# Patient Record
Sex: Female | Born: 1970 | Race: White | Hispanic: No | Marital: Single | Smoking: Former smoker
Health system: Southern US, Community
[De-identification: ages and names within clinical notes are randomized; demographics above are authoritative.]

## PROBLEM LIST (undated history)

## (undated) DIAGNOSIS — J45909 Unspecified asthma, uncomplicated: Secondary | ICD-10-CM

## (undated) DIAGNOSIS — G40909 Epilepsy, unspecified, not intractable, without status epilepticus: Secondary | ICD-10-CM

## (undated) DIAGNOSIS — I2510 Atherosclerotic heart disease of native coronary artery without angina pectoris: Secondary | ICD-10-CM

## (undated) DIAGNOSIS — I10 Essential (primary) hypertension: Secondary | ICD-10-CM

## (undated) DIAGNOSIS — E785 Hyperlipidemia, unspecified: Secondary | ICD-10-CM

## (undated) HISTORY — PX: SPINAL FUSION: SHX223

## (undated) HISTORY — PX: KNEE ARTHROSCOPY: SUR90

## (undated) HISTORY — PX: PARTIAL HYSTERECTOMY: SHX80

## (undated) HISTORY — PX: LAMINECTOMY: SHX219

---

## 1997-04-15 ENCOUNTER — Ambulatory Visit (HOSPITAL_COMMUNITY): Admission: RE | Admit: 1997-04-15 | Discharge: 1997-04-15 | Payer: Self-pay | Admitting: Obstetrics and Gynecology

## 1997-07-20 ENCOUNTER — Emergency Department (HOSPITAL_COMMUNITY): Admission: EM | Admit: 1997-07-20 | Discharge: 1997-07-20 | Payer: Self-pay | Admitting: Emergency Medicine

## 1997-07-22 ENCOUNTER — Ambulatory Visit (HOSPITAL_COMMUNITY): Admission: RE | Admit: 1997-07-22 | Discharge: 1997-07-22 | Payer: Self-pay | Admitting: Family Medicine

## 1998-06-11 ENCOUNTER — Ambulatory Visit (HOSPITAL_COMMUNITY): Admission: RE | Admit: 1998-06-11 | Discharge: 1998-06-11 | Payer: Self-pay | Admitting: Obstetrics and Gynecology

## 1998-06-13 ENCOUNTER — Inpatient Hospital Stay (HOSPITAL_COMMUNITY): Admission: AD | Admit: 1998-06-13 | Discharge: 1998-06-13 | Payer: Self-pay | Admitting: Obstetrics and Gynecology

## 1999-07-08 ENCOUNTER — Other Ambulatory Visit: Admission: RE | Admit: 1999-07-08 | Discharge: 1999-07-08 | Payer: Self-pay | Admitting: Obstetrics and Gynecology

## 1999-08-02 ENCOUNTER — Ambulatory Visit (HOSPITAL_COMMUNITY): Admission: RE | Admit: 1999-08-02 | Discharge: 1999-08-02 | Payer: Self-pay | Admitting: Obstetrics and Gynecology

## 2002-01-03 ENCOUNTER — Other Ambulatory Visit: Admission: RE | Admit: 2002-01-03 | Discharge: 2002-01-03 | Payer: Self-pay | Admitting: Obstetrics and Gynecology

## 2002-03-28 ENCOUNTER — Encounter (INDEPENDENT_AMBULATORY_CARE_PROVIDER_SITE_OTHER): Payer: Self-pay

## 2002-03-28 ENCOUNTER — Ambulatory Visit (HOSPITAL_COMMUNITY): Admission: RE | Admit: 2002-03-28 | Discharge: 2002-03-28 | Payer: Self-pay | Admitting: Obstetrics and Gynecology

## 2002-08-19 ENCOUNTER — Other Ambulatory Visit: Admission: RE | Admit: 2002-08-19 | Discharge: 2002-08-19 | Payer: Self-pay | Admitting: Obstetrics and Gynecology

## 2003-10-20 ENCOUNTER — Other Ambulatory Visit: Admission: RE | Admit: 2003-10-20 | Discharge: 2003-10-20 | Payer: Self-pay | Admitting: Obstetrics and Gynecology

## 2004-11-22 ENCOUNTER — Other Ambulatory Visit: Admission: RE | Admit: 2004-11-22 | Discharge: 2004-11-22 | Payer: Self-pay | Admitting: Obstetrics and Gynecology

## 2005-01-20 ENCOUNTER — Ambulatory Visit (HOSPITAL_COMMUNITY): Admission: RE | Admit: 2005-01-20 | Discharge: 2005-01-20 | Payer: Self-pay | Admitting: Obstetrics and Gynecology

## 2005-02-10 ENCOUNTER — Ambulatory Visit (HOSPITAL_COMMUNITY): Admission: AD | Admit: 2005-02-10 | Discharge: 2005-02-10 | Payer: Self-pay | Admitting: Obstetrics and Gynecology

## 2005-04-27 ENCOUNTER — Observation Stay (HOSPITAL_COMMUNITY): Admission: RE | Admit: 2005-04-27 | Discharge: 2005-04-28 | Payer: Self-pay | Admitting: Obstetrics and Gynecology

## 2005-04-27 ENCOUNTER — Encounter (INDEPENDENT_AMBULATORY_CARE_PROVIDER_SITE_OTHER): Payer: Self-pay | Admitting: *Deleted

## 2006-01-02 ENCOUNTER — Other Ambulatory Visit: Admission: RE | Admit: 2006-01-02 | Discharge: 2006-01-02 | Payer: Self-pay | Admitting: Obstetrics and Gynecology

## 2006-11-23 ENCOUNTER — Encounter: Admission: RE | Admit: 2006-11-23 | Discharge: 2006-11-23 | Payer: Self-pay | Admitting: Gastroenterology

## 2007-07-04 ENCOUNTER — Ambulatory Visit (HOSPITAL_COMMUNITY): Admission: RE | Admit: 2007-07-04 | Discharge: 2007-07-04 | Payer: Self-pay | Admitting: Neurology

## 2007-07-15 ENCOUNTER — Encounter: Admission: RE | Admit: 2007-07-15 | Discharge: 2007-07-15 | Payer: Self-pay | Admitting: Neurology

## 2007-08-02 ENCOUNTER — Encounter: Admission: RE | Admit: 2007-08-02 | Discharge: 2007-08-02 | Payer: Self-pay | Admitting: Neurology

## 2007-08-14 ENCOUNTER — Ambulatory Visit (HOSPITAL_COMMUNITY): Admission: RE | Admit: 2007-08-14 | Discharge: 2007-08-15 | Payer: Self-pay | Admitting: Neurosurgery

## 2007-09-16 ENCOUNTER — Encounter: Admission: RE | Admit: 2007-09-16 | Discharge: 2007-09-16 | Payer: Self-pay | Admitting: Neurosurgery

## 2007-10-18 ENCOUNTER — Ambulatory Visit (HOSPITAL_COMMUNITY): Admission: RE | Admit: 2007-10-18 | Discharge: 2007-10-18 | Payer: Self-pay | Admitting: Neurosurgery

## 2007-10-25 ENCOUNTER — Ambulatory Visit (HOSPITAL_COMMUNITY): Admission: RE | Admit: 2007-10-25 | Discharge: 2007-10-26 | Payer: Self-pay | Admitting: Neurosurgery

## 2008-02-08 ENCOUNTER — Encounter: Admission: RE | Admit: 2008-02-08 | Discharge: 2008-02-08 | Payer: Self-pay | Admitting: Neurosurgery

## 2008-02-25 ENCOUNTER — Inpatient Hospital Stay (HOSPITAL_COMMUNITY): Admission: RE | Admit: 2008-02-25 | Discharge: 2008-02-28 | Payer: Self-pay | Admitting: Neurosurgery

## 2008-07-31 ENCOUNTER — Encounter: Admission: RE | Admit: 2008-07-31 | Discharge: 2008-07-31 | Payer: Self-pay | Admitting: Neurosurgery

## 2009-04-27 ENCOUNTER — Encounter: Admission: RE | Admit: 2009-04-27 | Discharge: 2009-04-27 | Payer: Self-pay | Admitting: Neurosurgery

## 2010-04-18 IMAGING — CT CT L SPINE W/ CM
4 of 10 series · 10 of 33 positions shown, 12 images · IV contrast (omnipaque)
Comparison: Previous myelogram CT 07/31/2008. Previous MRI
02/08/2008.

MYELOGRAM INJECTION
TECHNIQUE: Informed consent was obtained from the patient prior to
the procedure, including potential complications of headache,
allergy, infection and pain. Specific instructions were given
regarding 24 hour bedrest post procedure to prevent post-LP
headache.  A timeout procedure was performed.  With the patient
prone, the lower back was prepped with Betadine.  1% Lidocaine was
used for local anesthesia.  Lumbar puncture was performed by the
radiologist at the L5 level using a 22 gauge needle with return of
clear CSF.  15 cc of Omnipaque 180 was injected into the
subarachnoid space .
CLINICAL DATA: Low back pain.  Right leg pain.
TECHNIQUE: Multidetector CT imaging of the lumbar spine was
performed following myelography.  Multiplanar CT image
reconstructions were also generated.

[Series 2: l spine bone · axial · 0.27mm/px · z∈[-178,-105]mm · 2 of 89 slices shown, 3 images]
[im 30/89  soft-tissue]
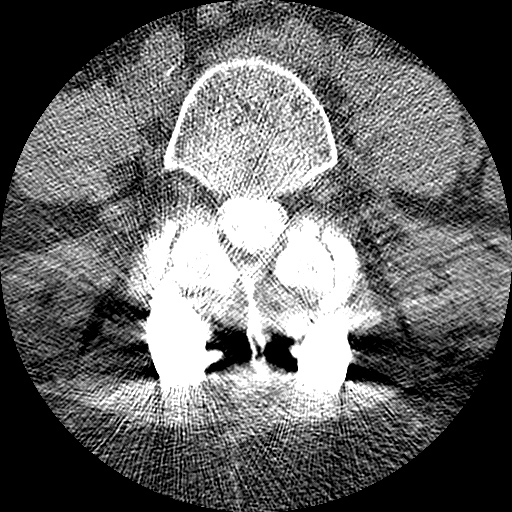
[im 30/89  bone]
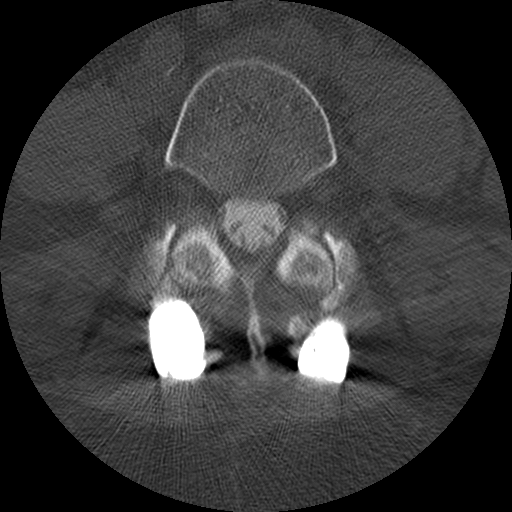
[im 59/89  bone]
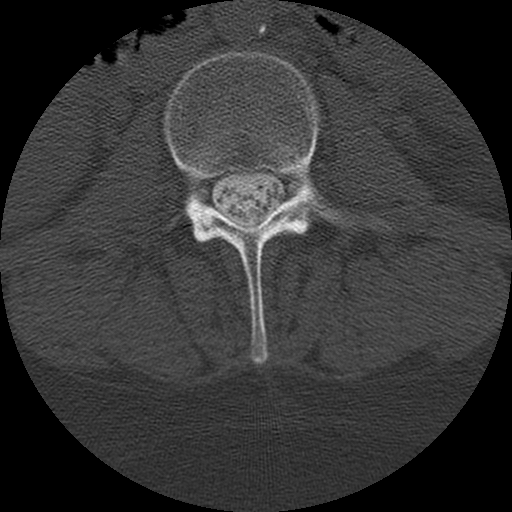

[Series 3: l spine soft · axial · 0.27mm/px · z∈[-178,-105]mm · 2 of 89 slices shown]
[im 30/89  soft-tissue]
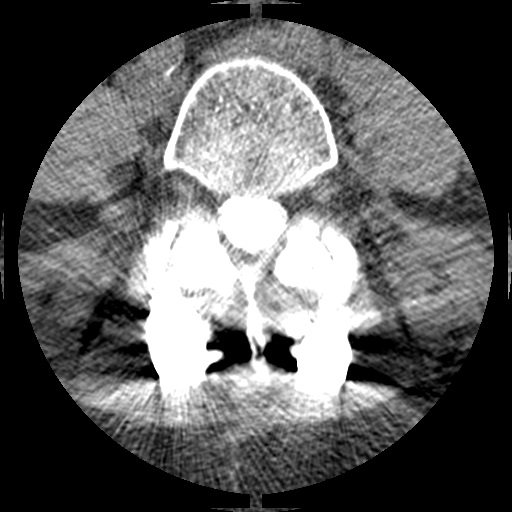
[im 59/89  soft-tissue]
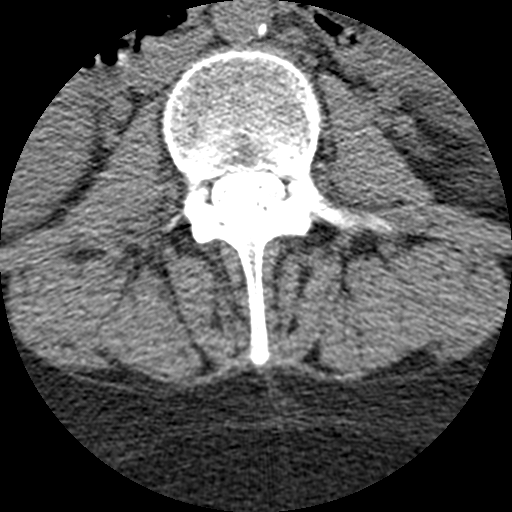

[Series 104: cor lower l-spine · coronal · 0.44mm/px · 1 of 40 slices shown]
[im 20/40  bone]
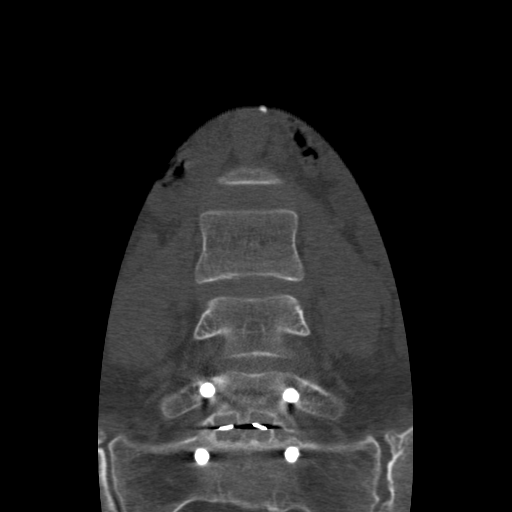

[Series 105: sag l-spine · sagittal · 0.44mm/px · 5 of 44 slices shown, 6 images]
[im 15/44  bone]
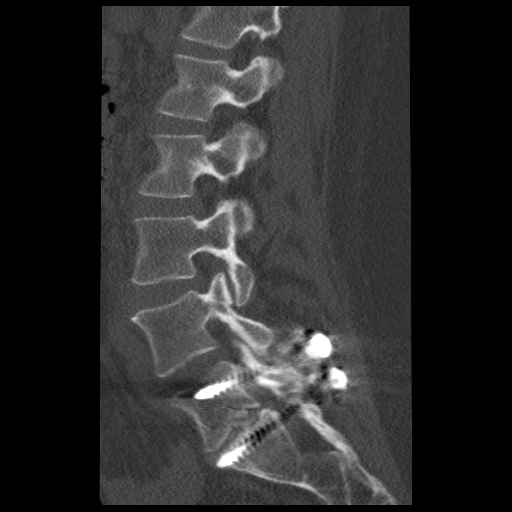
[im 18/44  bone]
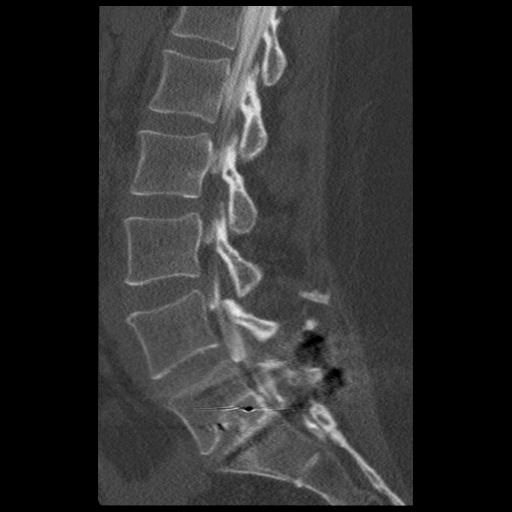
[im 22/44  soft-tissue]
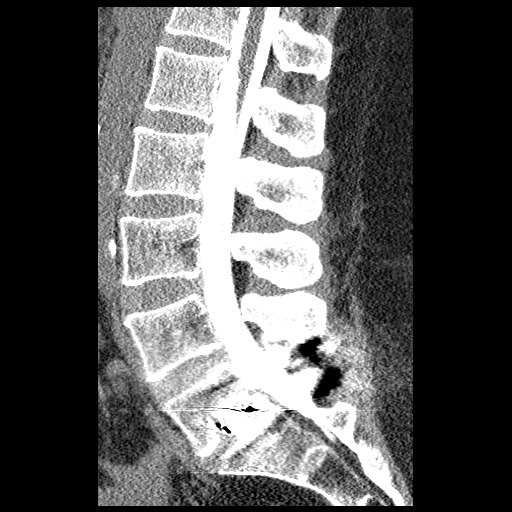
[im 22/44  bone]
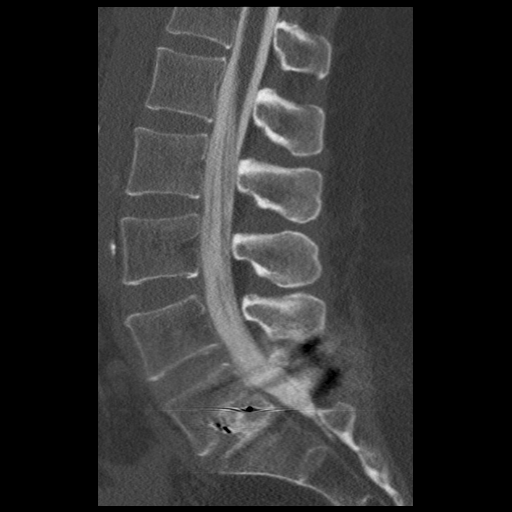
[im 26/44  bone]
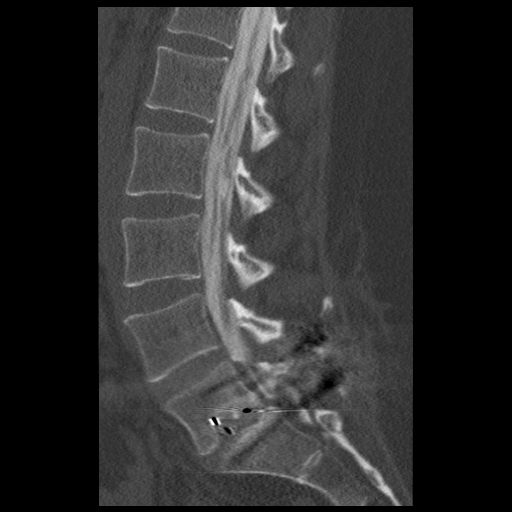
[im 29/44  bone]
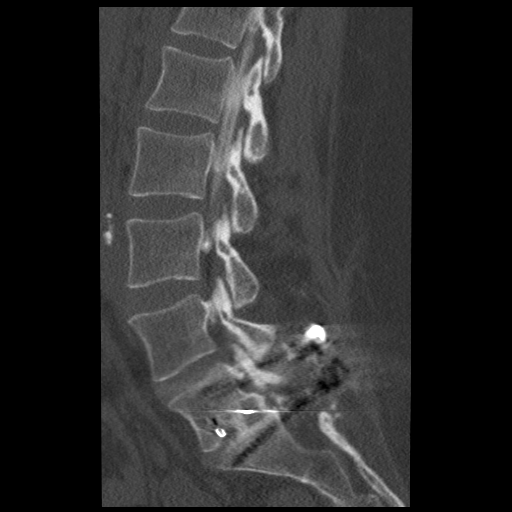

[10 of 33 positions shown; findings below may reference images not displayed]

IMPRESSION: Successful injection of  intrathecal contrast for myelography.

MYELOGRAM LUMBAR
FINDINGS: There is good opacification of the lumbar subarachnoid
space.  There is no nerve root cut off or spinal stenosis.  There
has been previous L5-S1 interbody fusion augmented with pedicle
screw and rod fixation.  Hardware is intact.  The interspace is
maintained.  There is no extradural defect or scarring at the L5-S1
level.  There is no obvious adjacent segment disease.

Standing flexion/extension views demonstrate no dynamic
instability.

Fluoroscopy Time: 0.51 minutes
IMPRESSION: As above.

CT MYELOGRAPHY LUMBAR SPINE
FINDINGS: No prevertebral or paraspinous masses.  Conus medullaris
normal.

L1-2: Normal.

L2-3: Normal.

L3-4: Small extraforaminal protrusion on the left without
compression of the left L3 nerve root.  Mild facet arthropathy.
Mild annular bulging.  There is no L4 nerve root compromise in the
canal.

L4-5: Moderate facet arthropathy.  Mild annular bulging.  No
stenosis or disc protrusion.

L5-S1: Solid fusion.  Hardware intact and appropriately placed.  No
canal stenosis or significant foraminal narrowing.

Compared to the previous myelogram and CT there has been further
healing of the fusion at L5-S1.  No adverse features are
appreciated.
IMPRESSION: Satisfactory appearance status post L5-S1 fusion.  Fusion is solid
and hardware is appropriately placed and intact.  There is no
residual stenosis or nerve root encroachment, no dynamic
instability, and no significant foraminal narrowing.

Mild annular bulging L4-5.

## 2010-05-30 LAB — COMPREHENSIVE METABOLIC PANEL
ALT: 18 U/L (ref 0–35)
AST: 22 U/L (ref 0–37)
Albumin: 3.3 g/dL — ABNORMAL LOW (ref 3.5–5.2)
Alkaline Phosphatase: 41 U/L (ref 39–117)
BUN: 7 mg/dL (ref 6–23)
Chloride: 112 mEq/L (ref 96–112)
Potassium: 3.9 mEq/L (ref 3.5–5.1)
Sodium: 144 mEq/L (ref 135–145)
Total Bilirubin: 0.9 mg/dL (ref 0.3–1.2)

## 2010-05-30 LAB — DIFFERENTIAL
Basophils Absolute: 0.1 10*3/uL (ref 0.0–0.1)
Basophils Relative: 1 % (ref 0–1)
Eosinophils Absolute: 0.2 10*3/uL (ref 0.0–0.7)
Eosinophils Relative: 4 % (ref 0–5)
Monocytes Absolute: 0.5 10*3/uL (ref 0.1–1.0)
Monocytes Relative: 10 % (ref 3–12)
Neutro Abs: 2 10*3/uL (ref 1.7–7.7)

## 2010-05-30 LAB — PROTIME-INR
INR: 1 (ref 0.00–1.49)
Prothrombin Time: 13.7 seconds (ref 11.6–15.2)

## 2010-05-30 LAB — CBC
HCT: 38.3 % (ref 36.0–46.0)
Platelets: 250 10*3/uL (ref 150–400)
WBC: 5 10*3/uL (ref 4.0–10.5)

## 2010-05-30 LAB — APTT: aPTT: 30 seconds (ref 24–37)

## 2010-06-28 NOTE — H&P (Signed)
NAME:  Morgan Leon, Morgan Leon NO.:  [id number]   MEDICAL RECORD NO.:  [id number]          PATIENT TYPE:  OIB   LOCATION:  3535                         FACILITY:  [facility]   PHYSICIAN:  Hilda Lias, M.D.   DATE OF BIRTH:  Aug 13, 1970   DATE OF ADMISSION:  10/25/2007  DATE OF DISCHARGE:                              HISTORY & PHYSICAL   Morgan Leon is a lady who underwent L5-S1 diskectomy in the right side 2  months ago.  The patient did well, but later she developed some back  pain.  She fell, from then on continued to get worse with pain going to  the right leg.  The patient came to my office, we did conservative  treatment.  We gave selective nerve root injection.  An MRI which was  not quite helpful.  Because of continuation of the pain, we did  myelogram, which showed that indeed there was some stenosis and some  scar tissue and difficult to see if indeed there was any recurrence.  The patient is not any better.  We have failed with every single  conservative treatment including strong pain medication without any  help.  She is being admitted for exploration of the area.   PAST MEDICAL HISTORY:  L5-S1 diskectomy 9 weeks ago.  She has a  hysterectomy, tubal ligation, and arthroscopy surgery.   ALLERGIES:  The patient allergic to PENICILLIN and SULFA.   FAMILY HISTORY:  Positive for high blood pressure and diabetes.   SOCIAL HISTORY:  She does not smoke any longer.  She drinks socially.   REVIEW OF SYSTEMS:  Positive for some headache, right leg pain, fainting  spells, congestive high blood pressure, and asthma.   PHYSICAL EXAMINATION:  GENERAL:  The patient came to my office limping  the right leg.  HEAD, EARS, NOSE, AND THROAT:  Normal.  NECK:  Normal.  LUNGS:  Rhonchi bilaterally.  HEART:  Heart sounds normal.  ABDOMEN:  Normal.  EXTREMITIES:  Normal pulse.  NEUROLOGIC:  She has straight leg raising on the right side, which is  positive 45 degrees.  She had  tenderness in the lumbar area.  Sensation  shows some numbness, which involved the outside of the right foot.   CLINICAL IMPRESSION:  Rule out recurrence L5-S1 herniated disk, right  side.   RECOMMENDATIONS:  The patient is being admitted for surgery.  We are  going to explore the L5-S1 area to see if there is something else beside  the stenosis, which might be giving the recurrence of the pain.  The  patient was at risks including possibility of infection, CSF leak, no  improve whatsoever, or need for surgery.           ______________________________  Hilda Lias, M.D.     EB/MEDQ  D:  10/25/2007  T:  10/26/2007  Job:  [id number]
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
## 2010-06-28 NOTE — H&P (Signed)
NAMETAHLOR, BERENGUER NO.:  [id number]   MEDICAL RECORD NO.:  [id number]          PATIENT TYPE:  INP   LOCATION:  3012                         FACILITY:  [facility]   PHYSICIAN:  Hilda Lias, M.D.   DATE OF BIRTH:  [birth year]   DATE OF ADMISSION:  02/25/2008  DATE OF DISCHARGE:                              HISTORY & PHYSICAL   Morgan Leon is a lady who underwent lumbar diskectomy.  Every time she did  well, but a few weeks later, she developed more back pain with leg pain.  After the procedure, the pain was intense.  She has quite a bit intense  back pain.  We did a workup to rule out the possibility of infection.  MRI showed she has a degenerative disk disease with a scar tissue on a  bone spur __________foraminal compromise in both nerve root.  Because of  the persistent pain and failure with conservative treatment including  __________, we are going to admit her for further surgery.   PAST MEDICAL HISTORY:  Hysterectomy, tubal ligation, arthroscopic  surgery, and lumbar diskectomy.   FAMILY HISTORY:  Positive for diabetes and high blood pressure.   SOCIAL HISTORY:  She drinks socially, does not smoke.   REVIEW OF SYSTEMS:  Positive for sinus headache, high blood pressure,  asthma, back pain, leg pain, spells, and fainting.   PHYSICAL EXAMINATION:  GENERAL:  The patient came to my office and she  walked with a short step.  She came on __________.  She was quite  miserable.  HEAD, EARS, NOSE, AND THROAT:  Normal.  NECK:  Normal.  LUNGS:  Clear.  ABDOMEN:  Normal.  EXTREMITIES:  Normal pulses.  NEUROLOGIC:  She has a decreased flexibility of the lumbar spine.  Straight leg raise is positive bilaterally about 45 degrees.   X-rays shows a severe degenerative disk disease with postoperative  changes at L5-S1, worse on the right side.   CLINICAL IMPRESSION:  Chronic back pain with degenerative disk disease  at L5-S1.   RECOMMENDATIONS:  The patient is being  admitted for surgery.  The  procedure will be bilateral L5-S1 diskectomy, interbody fusion with cage  and pedicle screws.  The patient knows about the risks of infection, CSF  leak, not improve whatsoever, and need for further surgery.           ______________________________  Hilda Lias, M.D.    EB/MEDQ  D:  02/25/2008  T:  02/26/2008  Job:  [id number]

## 2010-06-28 NOTE — Op Note (Signed)
[name], Morgan NO.:  [id number]   MEDICAL RECORD NO.:  [id number]          PATIENT TYPE:  INP   LOCATION:  3012                         FACILITY:  [facility]   PHYSICIAN:  Hilda Lias, M.D.   DATE OF BIRTH:  Jul 04, 1970   DATE OF PROCEDURE:  02/25/2008  DATE OF DISCHARGE:                               OPERATIVE REPORT   PREOPERATIVE DIAGNOSES:  Recurrent herniated disk with chronic back pain  and chronic radiculopathy.   POSTOPERATIVE DIAGNOSES:  Recurrent herniated disk with chronic back  pain and chronic radiculopathy.   PROCEDURES:  Bilateral L5 laminectomy, bilateral facetectomy, bilateral  diskectomy, 5-1.  Lysis of adhesions to decompress the right L5-S1 nerve  roots.  Interbody fusion with cages, pedicle screws, posterolateral  arthrodesis with BMP and autograft.  Cell Saver C-arm.   SURGEON:  Hilda Lias, MD.   ASSISTANT:  Danae Orleans. Venetia Maxon, MD   CLINICAL HISTORY:  [name] is a lady who had been complaining of back  pain radiating to the right leg.  She had been to Surgery.  The patient  did well initially, but later on continued to have more pain.  The  patient developed quite a bit of degenerative disk disease.  Diskitis  was ruled out.  Surgery was advised because of recurrence of the pain.   PROCEDURE:  The patient was taken to the OR and she was positioned in  prone manner.  The skin was cleaned with DuraPrep.  Midline incision  following the periwound was made all the way down to the spinous process  of L5.  Retraction was made all the way laterally until we are able to  see facet.  In the right side at the L5-S1 space was completely full of  adhesion.  Lysis was accomplished.  We were able to remove the scar from  the dural sac and the S1-L5 nerve root in the L5 than the S1.  We tried  to enter disk space, but we had to proceed with facetectomy to be able  to get into the disk space.  Once we went  into the disk space, we did  __________ discectomies removing the endplate.  Having concluded  diskectomy two cages of 12 x 22 with BMP and autograft were inserted.  Then using the C-arm first in AP view and later on a lateral view, we  drilled the pedicle of L5 and S1.  Four screws were inserted at the  level of endplate was 6.5 x 45 and at the level of S1 was 6.5 x 50.  Good position was seen in the x-ray.  Prior to introduction of the  pedicle screws, we brought the pedicle holes and they were surrounded by  bone in all quadrants.  The pedicle screw works using rods and caps.  We  went laterally and we removed the periosteum of the ala of the sacrum as  well as the facet of fine mix of BMP and autograft was used for  arthrodesis.  Valsalva maneuver was negative.  Fentanyl was left in the  pleural space and the wound was closed with  Vicryl and Steri-Strips.           ______________________________  Hilda Lias, M.D.     EB/MEDQ  D:  02/25/2008  T:  02/26/2008  Job:  [id number]

## 2010-06-28 NOTE — Op Note (Signed)
Morgan Leon, GEURIN NO.:  [id number]   MEDICAL RECORD NO.:  [id number]          PATIENT TYPE:  OIB   LOCATION:  3538                         FACILITY:  [facility]   PHYSICIAN:  Hilda Lias, M.D.   DATE OF BIRTH:  10/30/1970   DATE OF PROCEDURE:  08/14/2007  DATE OF DISCHARGE:                               OPERATIVE REPORT   PREOPERATIVE DIAGNOSIS:  Right L5-S1 herniated disk.   FINAL DIAGNOSIS:  Right L5-S1 herniated disk.   PROCEDURE:  Right L5-S1 diskectomy, removal of free fragment,  foraminotomy microscope.   SURGEON:  Hilda Lias, MD   ASSISTANT:  Danae Orleans. Venetia Maxon, MD   CLINICAL HISTORY:  The patient was admitted because of right leg pain,  which is getting worse and has failed all conservative treatment.  MRI  showed a large herniated disk at L5-S1 on the right side.  Surgery was  advised.   PROCEDURE:  The patient was taken to the OR and she was positioned in a  prone manner.  The skin was cleaned with DuraPrep.  The patient was  quite obese and x-rays showed __________ in the lower pole of the L5 and  S1.  A midline incision was made through the skin straight down to the  L5-S1 space.  With the microscope, we identified L5-S1 and we drilled  the lower lamina of L5 __________.  The yellow ligament also was  excised.  We found that the S1 nerve root was swollen, prepped, and  displaced.  There was a large herniated disk going to the body of S1.  Incision was made.  Large amount degenerative disk mediolaterally was  removed.  At the end, we had good decompression of the L5-S1 nerve root.  Then, Valsalva maneuver was negative.  Fentanyl and Depo-Medrol were  left in the epidural space.  The wound was closed with Vicryl and Steri-  Strips.           ______________________________  Hilda Lias, M.D.     EB/MEDQ  D:  08/14/2007  T:  08/15/2007  Job:  [id number]

## 2010-06-28 NOTE — Op Note (Signed)
[name], [name] NO.:  [id number]   MEDICAL RECORD NO.:  [id number]          PATIENT TYPE:  OIB   LOCATION:  3535                         FACILITY:  [facility]   PHYSICIAN:  Hilda Lias, M.D.   DATE OF BIRTH:  09/03/1970   DATE OF PROCEDURE:  10/25/2007  DATE OF DISCHARGE:                               OPERATIVE REPORT   PREOPERATIVE DIAGNOSIS:  Recurrence of right L5-S1 hernia disk.   POSTOPERATIVE DIAGNOSIS:  Recurrence of right L5-S1 hernia disk.   PROCEDURE:  Right L5-S1 exploration, removal of 2 fragment,  foraminotomy, decompression of the L5 and S1 root with microscope.   SURGEON:  Hilda Lias, M.D.   CLINICAL HISTORY:  [name] is a 40 year old female, obese, who about 9  weeks ago underwent right L5-S1 diskectomy.  The patient did well, but  then she fell.  Since then she has been having back pain, rising down to  the right leg.  She have failed with conservative treatment including  nerve root injection.  Myelogram was remarkable.  The MRIs show also  some postoperative changes.  Nevertheless, the patient has failed with  every single conservative treatment, and I was concerned about the  possibility of something not showing in x-ray.  In view of that she was  not admitted and she was absolutely bed rest, she was taken to surgery.   PROCEDURE:  The patient was taken to the OR.  She was positioned prone  on operating room.  The back was cleaned with DuraPrep.  The incision in  the midline through the previous one was made, through the skin,  subcutaneous tissue, through the thick adipose tissue down to the  fascia.  The fascia was opened, muscle was retracted laterally.  Immediately we brought the microscope into the area.  Indeed, once we  explored the S1 root there were 2 large fragment prior to the takeoff.  Cleaning of the S1 as well as the L5 nerves was done.  The disk was  quite narrow, but there was no evidence of any fragments at the  disk.  Then the area was irrigated.  The Valsalva maneuver was negative.  We  explored again the thecal sac, the midline, L5-S1 nerve root.  There was  no more fragment.  The wound was closed with Vicryl and dressed.           ______________________________  Hilda Lias, M.D.     EB/MEDQ  D:  10/25/2007  T:  10/26/2007  Job:  [id number]

## 2010-06-28 NOTE — Discharge Summary (Signed)
NAMETHIA, OLESEN NO.:  [id number]   MEDICAL RECORD NO.:  [id number]          PATIENT TYPE:  INP   LOCATION:  3012                         FACILITY:  [facility]   PHYSICIAN:  Hilda Lias, M.D.   DATE OF BIRTH:  January 02, 1971   DATE OF ADMISSION:  02/25/2008  DATE OF DISCHARGE:  02/28/2008                               DISCHARGE SUMMARY   ADMISSION DIAGNOSES:  Degenerative disk disease L5-S1 with chronic back  pain and chronic radiculopathy.   FINAL DIAGNOSES:  Degenerative disk disease L5-S1 with chronic back pain  and chronic radiculopathy.   CLINICAL HISTORY:  The patient was admitted because of back pain  radiating to both the lower extremities, right worse than the left one.  The patient had 2 previous lumbar surgeries.  Surgery including fusion  was advised.   LABORATORY:  Normal.   COURSE IN THE HOSPITAL:  The patient was taken to Surgery and L5-S1  diskectomy and fusion was done.  To date less than 72 hours, she is  awake, walking, has minimal complaint, and she wants to go home.   CONDITION ON DISCHARGE:  Improved.   MEDICATIONS:  1. Percocet.  2. Diazepam.   DIET:  Regular.   ACTIVITY:  Not to drive until she sees me.   FOLLOWUP:  Will be seeing me in 4 weeks or to call my office as needed.           ______________________________  Hilda Lias, M.D.     EB/MEDQ  D:  02/28/2008  T:  02/28/2008  Job:  [id number]

## 2010-07-01 NOTE — H&P (Signed)
[facility]  Patient:    Morgan Leon                       MRN: [id number] Adm. Date:  08/02/99 Attending:  Erie Noe P. Pennie Rushing, M.D.                         History and Physical  HISTORY OF PRESENT ILLNESS:       The patient is a 40 year old female, para 2-0-1-2, who presents for surgical sterilization.  She has considered her other contraceptive options and wishes to proceed with surgical sterilization since she wants no more children.  Last menstrual period was Jul 03, 1999. Current contraception is Pharmacist, hospital.  PAST MEDICAL HISTORY:             Significant for hypertension.  GYNECOLOGIC HISTORY:              Significant for two vaginal deliveries and one spontaneous miscarriage.  CURRENT MEDICATIONS:              1. Micronor.                                   2. Carbatrol 300 mg b.i.d.                                   3. Adalat 90 mg a day.                                   4. Hydrochlorothiazide 1/2 tablet a day.                                   5. Accupril 10 mg a day.  ALLERGIES:                        Drug Sensitivities: PENICILLIN and SULFA.  FAMILY HISTORY:                   Positive for diabetes.  SOCIAL HISTORY:                   Negative for smoking or recreational drugs, though the patient occasionally does drink alcohol.  PAST SURGICAL HISTORY:            Positive for arthroscopic surgery in 1988 and laser surgery for mild dysplasia in 1990.  Last Pap smear was Jul 08, 1999, and was normal.  REVIEW OF SYSTEMS:                Negative.  PHYSICAL EXAMINATION:  VITAL SIGNS:                      Blood pressure 108/82.  LUNGS:                            Clear.  HEART:                            Regular rate and rhythm.  ABDOMEN:  Soft without masses or organomegaly.  PELVIC:                           EG/BUS within normal limits.  The vagina is rugae.  The cervix without gross lesions and nontender.   The uterus is normal size, shape, consistency, anterior, mobile, and nontender.  Adnexa: No masses.   RECTOVAGINAL:                     Deferred.  IMPRESSION:                       1. Desire for surgical sterilization.                                   2. Chronic hypertension.  DISPOSITION:                      A long discussion was held with the patient concerning indications for her procedure which include her desire for surgical sterilization and the risks involved which include but are not limited to anesthesia, bleeding, infection, damage to adjacent organs, and failure of tubal sterilization resulting in subsequent pregnancy.  The patient seems to understand and wishes to proceed with laparoscopic tubal cautery at [facility] on August 02, 1999. DD:  07/27/99 TD:  07/27/99 Job: [id number]

## 2010-07-01 NOTE — Op Note (Signed)
Morgan Leon, Morgan Leon NO.:  [id number]   MEDICAL RECORD NO.:  [id number]          PATIENT TYPE:  OBV   LOCATION:  9316                          FACILITY:  [facility]   PHYSICIAN:  Janine Limbo, M.D.DATE OF BIRTH:  11-07-1970   DATE OF PROCEDURE:  04/27/2005  DATE OF DISCHARGE:                                 OPERATIVE REPORT   PREOPERATIVE DIAGNOSIS:  1.  Chronic pelvic pain.  2.  History of endometriosis.  3.  Ovarian cysts.   POSTOPERATIVE DIAGNOSIS:  1.  Chronic pelvic pain.  2.  History of endometriosis.  3.  Ovarian cysts.  4.  Pelvic adhesions.   PROCEDURE:  Laparoscopy assisted vaginal hysterectomy.   SURGEON:  Dr. Leonard Schwartz   FIRST ASSISTANT:  Hal Morales, M.D.   ANESTHETIC:  Was general.   DISPOSITION:  Morgan Leon is a 40 year old female, para 2-0-1-2, who presents  with the above-mentioned diagnosis for a laparoscopy assisted vaginal  hysterectomy. The patient understands the indications for surgical procedure  and she accepts the risks of, but not limited to, anesthetic complications,  bleeding, infection, and possible damage to surrounding organs. The patient  has had an ultrasound in the past that showed an ovarian cyst. 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   FINDINGS:  The uterus was upper limits normal size. Estimated weight is less  than 250 grams. The fallopian tubes were normal except for defects from her  prior tubal ligation. The right ovary appeared completely normal. There was  a scar from a ruptured corpus luteum. The left ovary appeared normal except  for a hyperpigmented area that was suspicious for endometriosis. The  posterior cul-de-sac was carefully inspected and there was no evidence of  endometriosis. The anterior cul-de-sac was carefully inspected and there was  no evidence of endometriosis. The appendix was identified and was  slightly  retrocecal. There were periappendiceal adhesions to the right pelvic  sidewall. There was no evidence of endometriosis and no evidence of a  carcinoid tumor. The liver and gallbladder appeared normal. The colon was  noted to be slightly adhered to the left pelvic sidewall.   PROCEDURE:  The patient was taken to the operating room where a general  anesthetic was given. The patient's abdomen, perineum, vagina were prepped  with multiple layers of Betadine. A Foley catheter was placed in the  bladder. Examination under anesthesia was performed. A Hulka tenaculum was  placed inside the uterus. The patient was sterilely draped. The subumbilical  area was injected with 6 cc of half percent Marcaine with epinephrine. A  subumbilical incision was made and carried sharply through the subcutaneous  tissue, fascia, and the anterior peritoneum. The Hassan cannula was sutured  into place. The pneumoperitoneum was obtained. The laparoscope was inserted.  The pelvic and abdominal structures were carefully inspected with findings  as mentioned above. A suprapubic area was injected with 3 mL of half percent  Marcaine with epinephrine. An incision was made and a 5 mm trocar was placed  in the lower abdomen  under direct visualization. Pictures were taken of the  patient's pelvic anatomy. The hyperpigmented area on the left ovary was  biopsied and sent to pathology. Hemostasis was achieved on the ovary using  the bipolar cautery. After careful inspection of all structures. We felt we  ready to proceed with the vaginal hysterectomy portion of our procedure. The  patient was placed in a more lithotomy position. A weighted speculum was  placed in the vagina. The cervix was injected with a diluted solution of  Pitressin and saline. A circumferential incision was made around the cervix  and the vaginal mucosa was advanced anteriorly and posteriorly. The anterior  cul-de-sac and posterior cul-de-sac were  then sharply entered. Alternating  from left-to-right the uterosacral ligaments, paracervical tissues,  parametrial tissues, and uterine arteries were clamped, cut, sutured, and  tied securely. The uterus was inverted through the posterior colpotomy. The  upper pedicles were then clamped and cut and the uterus was removed from the  operative field. The upper pedicles were secured using a free tie and then a  suture ligature. Hemostasis was noted to be adequate. The sutures attached  to the uterosacral ligaments were brought out through the vaginal angles and  tied securely. Again the pelvis was carefully inspected and hemostasis was  noted to be adequate. Two McCall culdoplasty sutures were placed in the  posterior cul-de-sac incorporating the uterosacral ligaments bilaterally and  the posterior peritoneum. All instruments were then removed except for the  weighted speculum. The vaginal cuff was closed using figure-of-eight sutures  incorporating the anterior vaginal mucosa, the anterior peritoneum,  posterior peritoneum, and then the posterior vaginal mucosa. The McCall  culdoplasty sutures were tied securely and the apex of the vagina was noted  to elevate into the mid pelvis. Sponge, needle, and instrument counts were  correct. The estimated blood loss was 100 mL. 0 Vicryl suture material used  to this point. The operator then changed gown and gloves. The  pneumoperitoneum was reestablished. The pelvis was carefully inspected. The  pelvis was irrigated and the irrigation fluid was aspirated. There was a  small area of bleeding on the vaginal cuff and hemostasis was achieved using  the bipolar cautery. At this point hemostasis was noted be adequate  throughout. There was no evidence of damage to the vital organs of the  pelvis or the abdomen. The pneumoperitoneum was allowed to escape. All  instruments were removed. The subumbilical incision was closed using 0 Vicryl in the fascia and  subcutaneous layer. The skin was reapproximated  using a subcuticular suture of 3-0 Monocryl. The suprapubic incision was  closed using 3-0 Monocryl. Sponge, needle, instrument counts were correct.  The estimated blood loss for the total procedure was 100 mL. The patient  tolerated her procedure well. She was awakened from her anesthetic without  difficulty and taken to the recovery room in stable condition. The patient  was noted to drain clear yellow urine at the end of our procedure. She  received 2500 mL of IV fluids.      Janine Limbo, M.D.  Electronically Signed     AVS/MEDQ  D:  04/27/2005  T:  04/28/2005  Job:  [id number]

## 2010-07-01 NOTE — Op Note (Signed)
[facility]  Patient:    Morgan Leon, HANZLIK                    MRN: [id number] Proc. Date: 08/02/99 Adm. Date:  [id number] Attending:  Dierdre Forth Pearline                           Operative Report  PREOPERATIVE DIAGNOSIS:       Desire for surgical sterilization.  POSTOPERATIVE DIAGNOSIS:      Desire for surgical sterilization.  OPERATION:                    Laparoscopic tubal cautery.  SURGEON:                      Vanessa P. Pennie Rushing, M.D.  ANESTHESIA:                   General orotracheal.  ESTIMATED BLOOD LOSS:         Less than 10 cc.  COMPLICATIONS:                None.  FINDINGS:                     The uterus and tubes appeared within normal limits.  The right ovary appeared within normal limits.  The left ovary contained a follicle cyst.  DESCRIPTION OF PROCEDURE:     The patient was taken to the operating room after appropriate identification and placed on the operating table.  After the attainment of adequate general anesthesia, the patient was placed in the modified lithotomy position.  The abdomen, perineum and vagina were prepped with multiple layers of Betadine and a red Robinson catheter used to empty the bladder.  A single tooth tenaculum was placed on the anterior cervix.  The abdomen was draped as a sterile field.  The subumbilical area was infiltrated with 0.25% Marcaine and a subumbilical incision made.  A Veress cannula was placed through that incision into the peritoneal cavity and pneumoperitoneum created with 4 L of CO2.  The laparoscopic trocar was placed through the incision after removal of the Veress cannula and the laparoscope placed through the trocar sleeve.  The cautery mechanism was then placed through the operating channel of the laparoscope and the right fallopian tube identified, followed to its fimbriated end, then grasped at the isthmic portion and cauterized in two adjacent areas.  A similar  procedure was carried out on the operative side.  Hemostasis was noted to be adequate.  All instruments were removed from the peritoneal cavity under direct visualization as the CO2 was allow to escape.  The subumbilical incision was closed with a subcuticular suture of 3-0 Vicryl.  A sterile dressing was applied and the single tooth tenaculum removed.  The patient was awakened from general anesthesia and taken to the recovery room in satisfactory condition, having tolerated the procedure well with sponge and instrument counts correct. DD:  08/02/99 TD:  08/03/99 Job: [id number]

## 2010-07-01 NOTE — H&P (Signed)
Morgan Leon, Morgan Leon NO.:  [id number]   MEDICAL RECORD NO.:  [id number]          PATIENT TYPE:  AMB   LOCATION:  SDC                           FACILITY:  [facility]   PHYSICIAN:  Janine Limbo, M.D.DATE OF BIRTH:  July 15, 1970   DATE OF ADMISSION:  DATE OF DISCHARGE:                                HISTORY & PHYSICAL   DATE OF SURGERY:  April 27, 2005.   HISTORY OF PRESENT ILLNESS:  Morgan Leon is a 40 year old female, para 2-0-1-  2, who presents for a laparoscopically-assisted vaginal hysterectomy.  The  patient has been followed at the [facility]  [facility] of [facility].  The patient complains of  persistent and chronic pelvic pain. She is currently being treated  with  multiple medications including Vicodin, Flexeril and ibuprofen.  Even with  this, she continues to have a great deal of discomfort.  The patient reports  that she has been told in the past that she had endometriosis although I  cannot find documentation of this.  The patient has had an ultrasound of the  pelvis performed and no particular pathology was noted.  She has been told  of ovarian cysts in the past.  She has had a CT scan of the abdomen and  pelvis both of which were read as normal.  Gonorrhea and Chlamydia cultures  were negative.  The patient had a laparoscopic tubal cautery performed in  2001.  Operative findings at the time included a normal uterus and normal  ovaries.  The right ovary appeared normal.  The left ovary contained a  follicular cyst at that time.  The patient's most recent Pap smear was  within normal limits.  The patient has been noted in the past to have  cervical intraepithelial neoplasia II.  She had a LEEP (loop electrosurgical  excision procedure) in 2004.   OBSTETRICAL HISTORY:  The patient has had two term vaginal deliveries and  one spontaneous miscarriage.   DRUG ALLERGIES:  The patient reports that she is  allergic to PENICILLIN and  SULFA medications.  These cause her to have swelling and hives.   PAST MEDICAL HISTORY:  The patient has a history of hypertension and also  hypercholesterolemia.   CURRENT MEDICATIONS:  Include Topamax, Altace, Lipitor, ibuprofen, Vicodin,  Flexeril, multivitamins, calcium and vitamin C.   SOCIAL HISTORY:  The patient drinks alcohol socially. She denies cigarette  use and other recreational drug uses.   REVIEW OF SYSTEMS:  Noncontributory.   FAMILY HISTORY:  The patient's mother has hypertension, diabetes and  hypercholesterolemia.   PHYSICAL EXAMINATION:  HEENT:  Within normal limits.  CHEST:  Clear.  HEART:  Regular rate and rhythm.  BREASTS:  Are without masses.  ABDOMEN:  Soft, nontender.  EXTREMITIES:  Grossly normal.  NEUROLOGICAL:  Grossly normal.  PELVIC EXAMINATION:  External genitalia is normal.  Vagina is normal.  Cervix is nontender and no lesions are appreciated.  The uterus is normal  size, shape and consistency.  The uterus is tender.  Adnexa no masses are  appreciated.  Rectovaginal examination confirms.   ASSESSMENT:  1.  Chronic pelvic pain.  2.  Undocumented history of endometriosis.  3.  History of ovarian cysts.  4.  History of cervical intraepithelial neoplasia II, now status post loop      electrosurgical excision procedure.  5.  Hypertension.  6.  Hypercholesterolemia.   PLAN:  The patient will undergo a laparoscopically assisted vaginal  hysterectomy.  She understands the indications for her surgical procedure  and she accepts the risks of, but not limited to, anesthetic complications,  bleeding, infections and possible damage to the surrounding organs.  She  understands that no guarantees can be given concerning the total relief of  her pelvic pain.  The patient has given permission for [place] to perform an  oophorectomy if pathology is found.      Janine Limbo, M.D.  Electronically Signed     AVS/MEDQ  D:   04/21/2005  T:  04/22/2005  Job:  [id number]   cc:   Caryn Bee L. Little, M.D.  Fax: [phone]

## 2010-07-01 NOTE — Discharge Summary (Signed)
Morgan Leon, Morgan Leon NO.:  [id number]   MEDICAL RECORD NO.:  [id number]          PATIENT TYPE:  OBV   LOCATION:  9316                          FACILITY:  [facility]   PHYSICIAN:  Janine Limbo, M.D.DATE OF BIRTH:  [birth year]   DATE OF ADMISSION:  04/27/2005  DATE OF DISCHARGE:  04/28/2005                                 DISCHARGE SUMMARY   ADMISSION DIAGNOSES:  1.  Chronic pelvic pain.  2.  Endometriosis.  3.  Ovarian cyst.   POSTOPERATIVE DIAGNOSIS:  1.  Chronic pelvic pain.  2.  Endometriosis.  3.  Ovarian cyst.  4.  Pelvic adhesions.   PROCEDURE:  This admission, April 27, 2005, laparoscopy assisted vaginal  hysterectomy.   HISTORY OF PRESENT ILLNESS:  Morgan Leon is a 40 year old female with the  above-mentioned diagnoses.  She presents for laparoscopy-assisted vaginal  hysterectomy.  Please see her dictated history and physical exam for  details.   ADMISSION PHYSICAL EXAMINATION:  VITAL SIGNS:  The patient was afebrile and  her vital signs were stable.  PELVIC:  Her uterus was normal size.   HOSPITAL COURSE:  On the day of admission, the patient was taken to the  operating room.  Operative findings included an area of hyperpigmented cells  on the left ovary and this was thought to be suspicious for endometriosis.  The ovaries appeared normal.  There was a corpus luteum cyst present on the  right ovary.  There was no evidence of endometriosis in the anterior cul-de-  sac or in the posterior cul-de-sac.  The patient did have adhesions along  her appendix as well as some adhesions between the bowel and the left pelvic  sidewall.  The patient then had a vaginal hysterectomy without difficulty.  She tolerated her procedure well.  The estimated blood loss was 100 cc.  The  patient remained afebrile throughout her hospital course.  She quickly  tolerated a regular diet.  Her postoperative hemoglobin was 10.5  (preoperative hemoglobin 13.0.)  By  postoperative day #1, she was felt to be  ready for discharge.   DISCHARGE MEDICATIONS:  1.  Vicodin one to two every 4 hours as needed for pain.  2.  Motrin 600 mg every 6 hours as needed for pain.  3.  Flexeril 10 mg 1 tablet b.i.d. for pain.  4.  Phenergan 25 mg every 6 hours as needed for nausea.  5.  Iron 325 mg each day (the patient will take a multivitamin with iron      each day as well.)  6.  The patient will continue her preoperative medications.   DISCHARGE INSTRUCTIONS:  1.  The patient will return to see Dr. Kirkland Hun in 6 weeks.  She will      call for questions or concerns.  2.  She will refrain from driving for 2 weeks, heavy lifting for 4 weeks,      and intercourse for 6 weeks.  3.  She was given a copy of the postoperative instruction sheet as prepared      by the [facility] of [facility].   FINAL PATHOLOGY REPORT:  Pending at the time of discharge.      Janine Limbo, M.D.  Electronically Signed     AVS/MEDQ  D:  04/28/2005  T:  04/29/2005  Job:  [id number]   cc:   Caryn Bee L. Little, M.D.  Fax: [phone]

## 2010-07-01 NOTE — Op Note (Signed)
   NAME:  Morgan Leon, Morgan Leon                        ACCOUNT NO.:  [id number]   MEDICAL RECORD NO.:  [id number]                   PATIENT TYPE:  AMB   LOCATION:  SDC                                  FACILITY:  [facility]   PHYSICIAN:  Crist Fat. Rivard, M.D.              DATE OF BIRTH:  [birth year]   DATE OF PROCEDURE:  03/28/2002  DATE OF DISCHARGE:                                 OPERATIVE REPORT   PREOPERATIVE DIAGNOSES:  Moderate cervical dysplasia.   POSTOPERATIVE DIAGNOSES:  Moderate cervical dysplasia.   ANESTHESIA:  Paracervical block.   PROCEDURE:  Loop electrosurgical excision procedure.   SURGEON:  Crist Fat. Rivard, M.D.   ESTIMATED BLOOD LOSS:  Minimal.   PROCEDURE:  After being informed of the planned procedure with possible  complications including bleeding, infection, cervical stenosis, cervical  incompetence, recurrence of the dysplasia, informed consent is obtained.  The patient is taken to OR number three, placed in a lithotomy position.  A  __________ speculum is inserted and the cervix is prepped with 4% acetic  acid.  We proceed with the paracervical block using 20 mL of lidocaine 1%  with epinephrine in a usual fashion.  We then visualize the lesion which  extends from 11 o'clock to 2 o'clock acetowhite with mosaic pattern  compatible with the previously diagnosed moderate dysplasia.  Using a large  loop we proceed with the LEEP in two passes for anterior and posterior lip.  The bed of the excision site is then cauterized using a ball and applying  Monsel.  Hemostasis is adequate.  The specimen is identified for pathology.  Instruments are removed.  The patient is returned to the recovery room in  well and stable condition.  Please note that instruments and sponge count  was complete x2 and estimated blood loss was minimal.                                               Dois Davenport A. Rivard, M.D.    SAR/MEDQ  D:  03/28/2002  T:  03/28/2002  Job:  [id number]

## 2010-09-23 DIAGNOSIS — G40909 Epilepsy, unspecified, not intractable, without status epilepticus: Secondary | ICD-10-CM | POA: Insufficient documentation

## 2010-09-23 DIAGNOSIS — G43909 Migraine, unspecified, not intractable, without status migrainosus: Secondary | ICD-10-CM | POA: Insufficient documentation

## 2010-11-10 LAB — CBC
HCT: 40.2
Hemoglobin: 14.1
MCV: 95.7
Platelets: 302
RDW: 12.2

## 2010-11-10 LAB — BASIC METABOLIC PANEL
BUN: 6
Chloride: 104
GFR calc non Af Amer: >60
Glucose, Bld: 84
Potassium: 4
Sodium: 137

## 2010-11-16 LAB — CBC
HCT: 41.3
Hemoglobin: 14.4
MCHC: 34.7
Platelets: 333
RDW: 12.3

## 2012-06-14 DIAGNOSIS — I10 Essential (primary) hypertension: Secondary | ICD-10-CM | POA: Insufficient documentation

## 2012-06-18 DIAGNOSIS — F17210 Nicotine dependence, cigarettes, uncomplicated: Secondary | ICD-10-CM | POA: Insufficient documentation

## 2012-09-13 DIAGNOSIS — E785 Hyperlipidemia, unspecified: Secondary | ICD-10-CM | POA: Insufficient documentation

## 2013-03-06 DIAGNOSIS — J4520 Mild intermittent asthma, uncomplicated: Secondary | ICD-10-CM | POA: Insufficient documentation

## 2019-05-25 ENCOUNTER — Encounter (HOSPITAL_COMMUNITY): Payer: Self-pay | Admitting: Urgent Care

## 2019-05-25 ENCOUNTER — Ambulatory Visit (HOSPITAL_COMMUNITY)
Admission: EM | Admit: 2019-05-25 | Discharge: 2019-05-25 | Disposition: A | Discharge status: Home / Self Care | Payer: Self-pay | Attending: Urgent Care | Admitting: Urgent Care

## 2019-05-25 ENCOUNTER — Other Ambulatory Visit: Payer: Self-pay

## 2019-05-25 ENCOUNTER — Ambulatory Visit (INDEPENDENT_AMBULATORY_CARE_PROVIDER_SITE_OTHER): Payer: Self-pay

## 2019-05-25 DIAGNOSIS — M7989 Other specified soft tissue disorders: Secondary | ICD-10-CM

## 2019-05-25 DIAGNOSIS — S92592A Other fracture of left lesser toe(s), initial encounter for closed fracture: Secondary | ICD-10-CM

## 2019-05-25 DIAGNOSIS — S92515A Nondisplaced fracture of proximal phalanx of left lesser toe(s), initial encounter for closed fracture: Secondary | ICD-10-CM

## 2019-05-25 DIAGNOSIS — X58XXXA Exposure to other specified factors, initial encounter: Secondary | ICD-10-CM

## 2019-05-25 DIAGNOSIS — M79672 Pain in left foot: Secondary | ICD-10-CM

## 2019-05-25 HISTORY — DX: Essential (primary) hypertension: I10

## 2019-05-25 HISTORY — DX: Unspecified asthma, uncomplicated: J45.909

## 2019-05-25 HISTORY — DX: Hyperlipidemia, unspecified: E78.5

## 2019-05-25 MED ORDER — IBUPROFEN 800 MG PO TABS: 800.0000 mg | ORAL_TABLET | Freq: Three times a day (TID) | ORAL | 0 refills | Status: DC

## 2019-05-25 MED ORDER — KETOROLAC TROMETHAMINE 60 MG/2ML IM SOLN
INTRAMUSCULAR | Status: AC
  Filled 2019-05-25: qty 2

## 2019-05-25 MED ORDER — HYDROCODONE-ACETAMINOPHEN 5-325 MG PO TABS: 1.0000 | ORAL_TABLET | Freq: Four times a day (QID) | ORAL | 0 refills | Status: DC | PRN

## 2019-05-25 MED ORDER — IBUPROFEN 600 MG PO TABS: 600.0000 mg | ORAL_TABLET | Freq: Three times a day (TID) | ORAL | 0 refills | Status: DC

## 2019-05-25 MED ORDER — KETOROLAC TROMETHAMINE 60 MG/2ML IM SOLN
60.0000 mg | Freq: Once | INTRAMUSCULAR | Status: AC
  Administered 2019-05-25: 17:00:00 60 mg via INTRAMUSCULAR

## 2019-05-25 NOTE — ED Triage Notes (Signed)
Pt presents with complaints of pain to her left foot after hitting it on the bottom of her bed a couple of hours ago. The top of patients left foot is bruised into her toes. It is difficult for the patient to walk. CMS intact.

## 2019-05-25 NOTE — Discharge Instructions (Addendum)
You may take 500mg -650mg  Tylenol with ibuprofen 600mg  every 6 hours for aches, pains, fevers and general inflammation. Use hydrocodone for breakthrough pain only.

## 2019-05-25 NOTE — ED Provider Notes (Signed)
[facility]   MRN: [id number] DOB: [birth year]  Subjective:   Tira Lafferty is a 49 y.o. female presenting for suffering an acute left foot injury today from colliding with one of the bedpost.  Patient states that she heard a snap and had subsequent severe pain with bruising and swelling.  She is having significant difficulty bearing weight.  Has not taken anything for pain since coming here.  No current facility-administered medications for this encounter.  Current Outpatient Medications:  .  atorvastatin (LIPITOR) 80 MG tablet, Take 80 mg by mouth daily., Disp: , Rfl:  .  carbamazepine (TEGRETOL) 200 MG tablet, Take 200 mg by mouth 3 (three) times daily., Disp: , Rfl:  .  carvedilol (COREG) 12.5 MG tablet, Take 12.5 mg by mouth 2 (two) times daily with a meal., Disp: , Rfl:  .  lisinopril (ZESTRIL) 10 MG tablet, Take 10 mg by mouth daily., Disp: , Rfl:  .  PRESCRIPTION MEDICATION, Heart medication unknown name, Disp: , Rfl:    Allergies  Allergen Reactions  . Penicillins   . Sulfa Antibiotics     Past Medical History:  Diagnosis Date  . Asthma   . Hyperlipidemia   . Hypertension      Past Surgical History:  Procedure Laterality Date  . KNEE ARTHROSCOPY    . PARTIAL HYSTERECTOMY    . SPINAL FUSION      Family History  Problem Relation Age of Onset  . Kidney disease Mother     Social History   Tobacco Use  . Smoking status: Former Games developer  . Smokeless tobacco: Never Used  Substance Use Topics  . Alcohol use: Yes    Comment: socially  . Drug use: Never    ROS   Objective:   Vitals: BP (!) 140/97   Pulse 79   Temp 98.1 F (36.7 C)   Resp 18   SpO2 93%   Physical Exam Constitutional:      General: She is not in acute distress.    Appearance: Normal appearance. She is well-developed. She is not ill-appearing, toxic-appearing or diaphoretic.  HENT:     Head: Normocephalic and atraumatic.     Nose: Nose normal.     Mouth/Throat:     Mouth:  Mucous membranes are moist.     Pharynx: Oropharynx is clear.  Eyes:     General: No scleral icterus.    Extraocular Movements: Extraocular movements intact.     Pupils: Pupils are equal, round, and reactive to light.  Cardiovascular:     Rate and Rhythm: Normal rate.  Pulmonary:     Effort: Pulmonary effort is normal.  Musculoskeletal:     Left foot: Decreased range of motion. Normal capillary refill. Swelling (Over area outlined with associated ecchymosis), deformity (At the level of the fourth toe), tenderness, bony tenderness and crepitus present. No laceration.       Feet:  Skin:    General: Skin is warm and dry.  Neurological:     General: No focal deficit present.     Mental Status: She is alert and oriented to person, place, and time.  Psychiatric:        Mood and Affect: Mood normal.        Behavior: Behavior normal.        Thought Content: Thought content normal.        Judgment: Judgment normal.     DG Foot Complete Left  Result Date: 05/25/2019 CLINICAL DATA:  49 year old female with  left foot injury. EXAM: LEFT FOOT - COMPLETE 3+ VIEW COMPARISON:  Left radiograph dated 08/19/2009. FINDINGS: There is a nondisplaced oblique fracture of the proximal phalanx of the fourth digit. No other acute fracture identified. There is no dislocation. The bones are well mineralized. No arthritic changes. The soft tissues are unremarkable. IMPRESSION: Nondisplaced oblique fracture of the proximal phalanx of the fourth digit. Electronically Signed   By: Anner Crete M.D.   On: 05/25/2019 17:01   Assessment and Plan :   1. Left foot pain   2. Swelling of left foot   3. Closed nondisplaced fracture of proximal phalanx of lesser toe of left foot, initial encounter     Patient is neurovascularly intact.  Wear post-op shoe, weight bearing as tolerated. Given patient difficulty walking, provided her with crutches. Schedule APAP and ibu, hydrocodone for breakthrough pain. Follow up with  ortho. Counseled patient on potential for adverse effects with medications prescribed/recommended today, ER and return-to-clinic precautions discussed, patient verbalized understanding.    Jaynee Eagles, PA-C 05/25/19 1711

## 2020-05-15 IMAGING — DX DG FOOT COMPLETE 3+V*L*
3 series · 3 of 3 positions shown · non-contrast
Comparison: Left radiograph dated 08/19/2009.

CLINICAL DATA: 48-year-old female with left foot injury.

EXAM:
LEFT FOOT - COMPLETE 3+ VIEW

[foot ap]
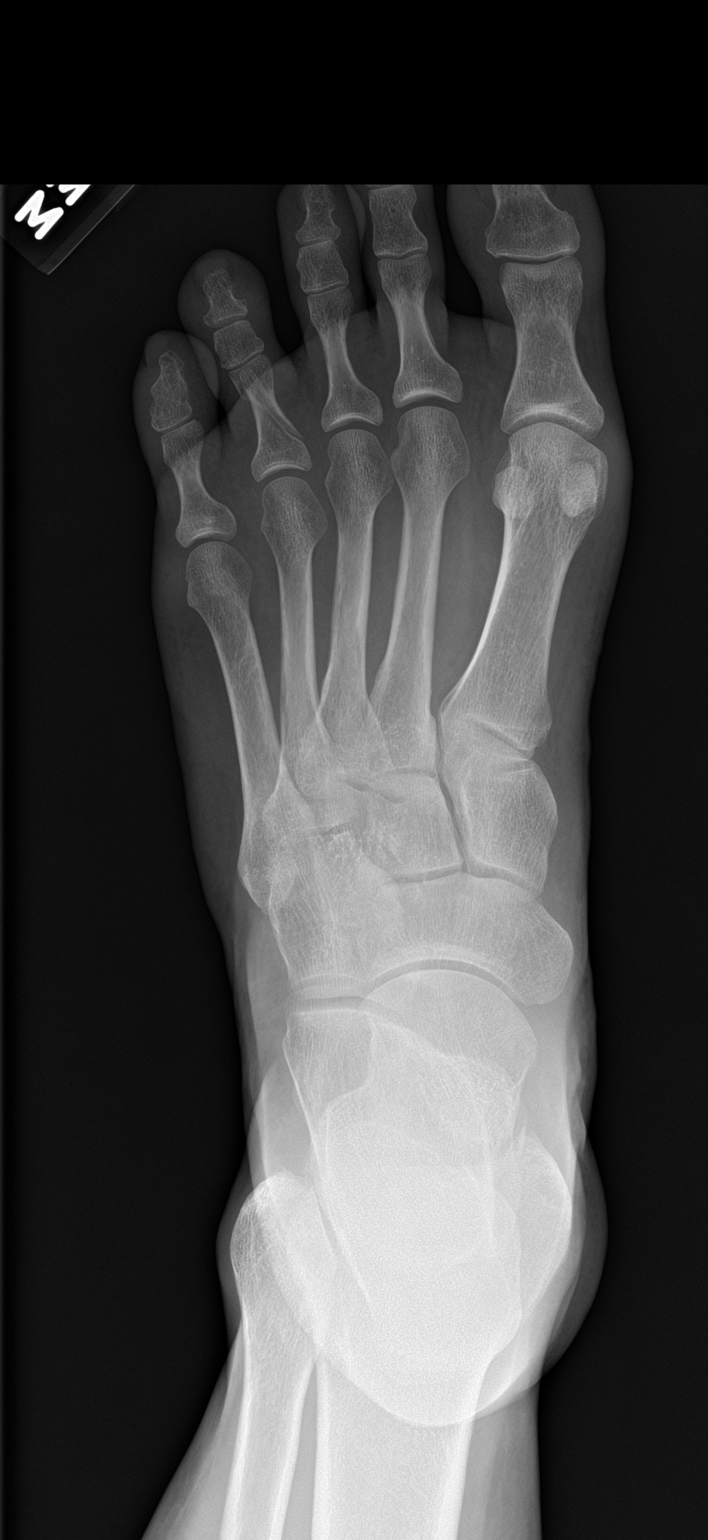

[foot obl]
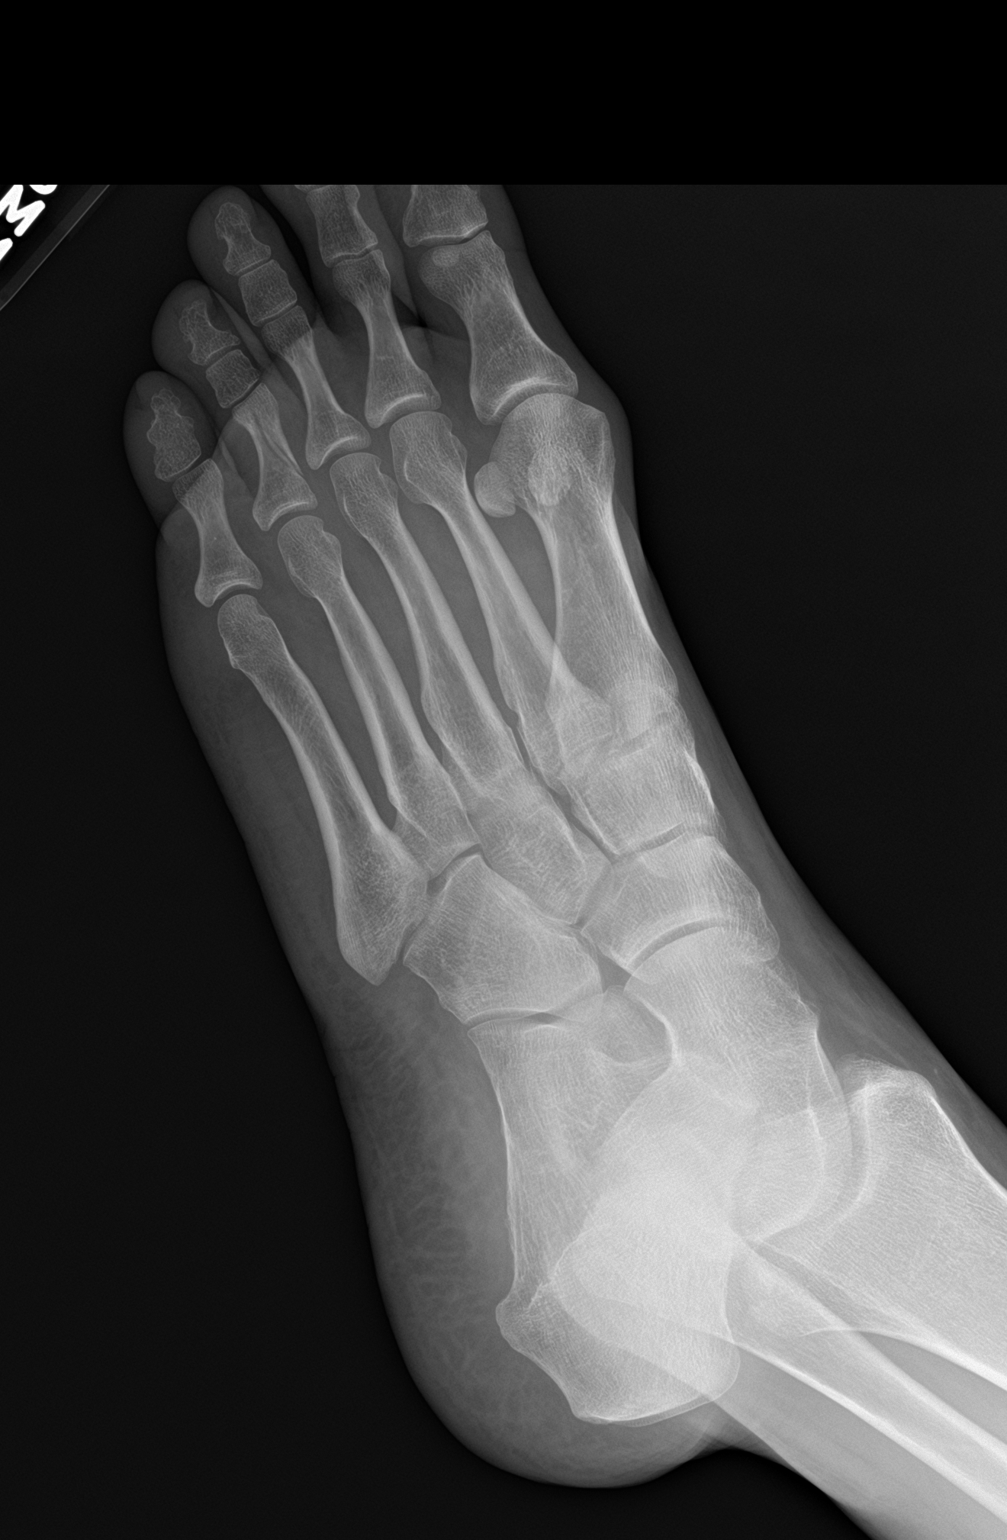

[foot lat]
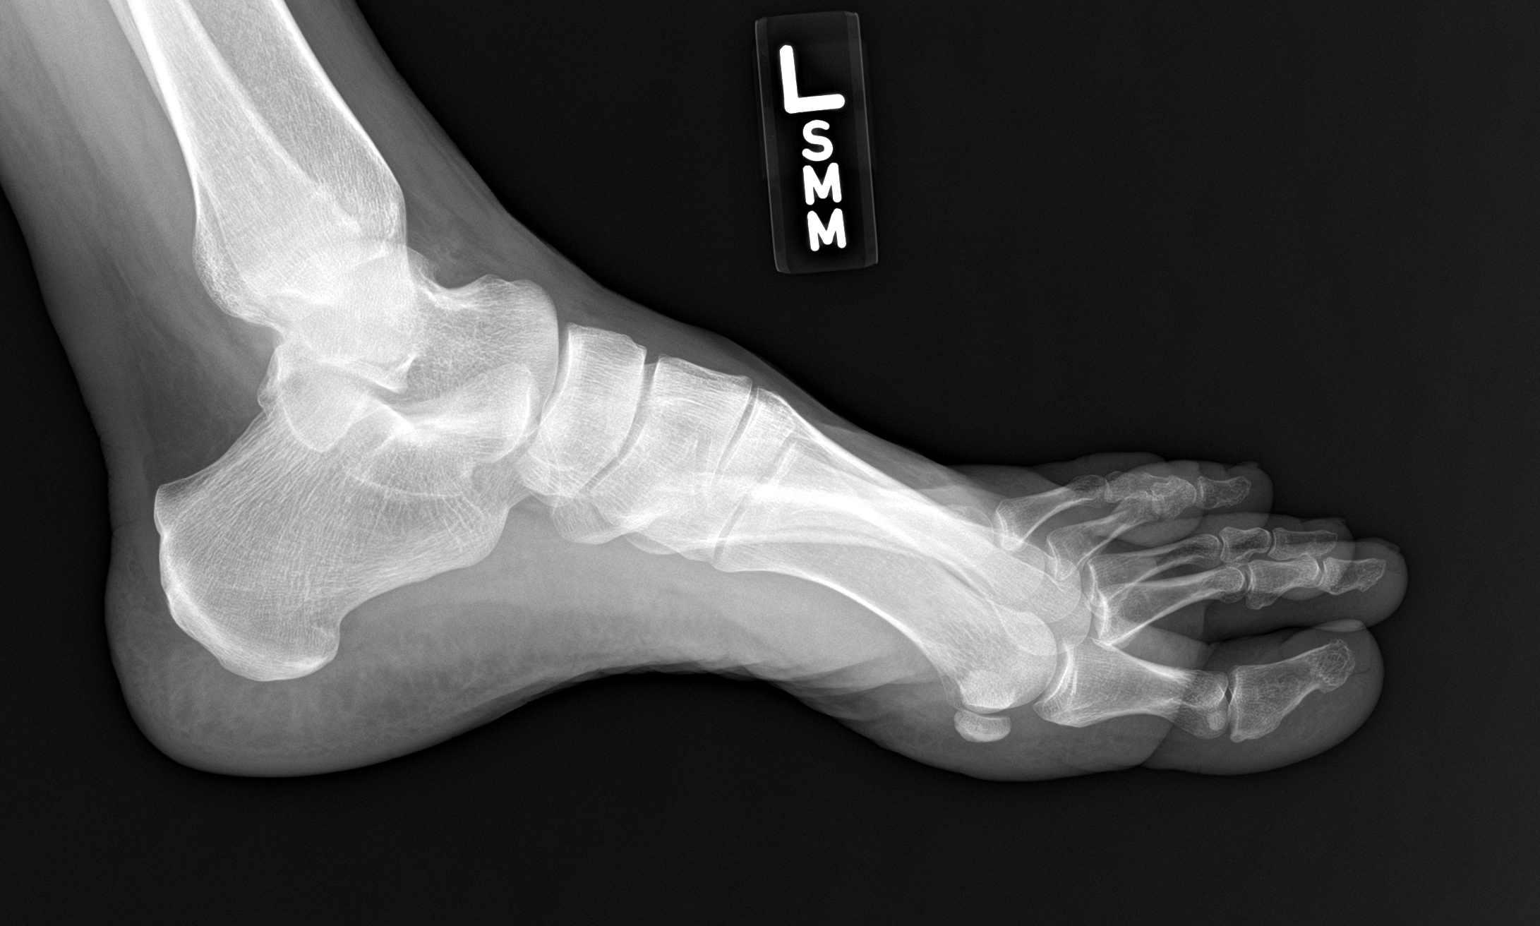

[3 of 3 positions shown; findings below may reference images not displayed]

FINDINGS: There is a nondisplaced oblique fracture of the proximal phalanx of
the fourth digit. No other acute fracture identified. There is no
dislocation. The bones are well mineralized. No arthritic changes.
The soft tissues are unremarkable.
IMPRESSION: Nondisplaced oblique fracture of the proximal phalanx of the fourth
digit.

## 2022-08-26 DIAGNOSIS — E871 Hypo-osmolality and hyponatremia: Secondary | ICD-10-CM | POA: Insufficient documentation

## 2023-08-16 DIAGNOSIS — M5416 Radiculopathy, lumbar region: Secondary | ICD-10-CM | POA: Insufficient documentation

## 2023-10-11 DIAGNOSIS — M549 Dorsalgia, unspecified: Secondary | ICD-10-CM | POA: Insufficient documentation

## 2023-12-18 DIAGNOSIS — M25561 Pain in right knee: Secondary | ICD-10-CM | POA: Insufficient documentation

## 2023-12-26 ENCOUNTER — Ambulatory Visit: Admission: EM | Admit: 2023-12-26 | Discharge: 2023-12-26 | Disposition: A | Discharge status: Home / Self Care

## 2023-12-26 DIAGNOSIS — J4521 Mild intermittent asthma with (acute) exacerbation: Secondary | ICD-10-CM

## 2023-12-26 DIAGNOSIS — R0602 Shortness of breath: Secondary | ICD-10-CM | POA: Diagnosis not present

## 2023-12-26 DIAGNOSIS — Z8701 Personal history of pneumonia (recurrent): Secondary | ICD-10-CM | POA: Diagnosis not present

## 2023-12-26 DIAGNOSIS — R051 Acute cough: Secondary | ICD-10-CM | POA: Diagnosis not present

## 2023-12-26 HISTORY — DX: Epilepsy, unspecified, not intractable, without status epilepticus: G40.909

## 2023-12-26 HISTORY — DX: Atherosclerotic heart disease of native coronary artery without angina pectoris: I25.10

## 2023-12-26 MED ORDER — CEFDINIR 300 MG PO CAPS: 300.0000 mg | ORAL_CAPSULE | Freq: Two times a day (BID) | ORAL | 0 refills | Status: AC

## 2023-12-26 MED ORDER — FLUTICASONE-SALMETEROL 250-50 MCG/ACT IN AEPB: 1.0000 | INHALATION_SPRAY | Freq: Two times a day (BID) | RESPIRATORY_TRACT | 2 refills | Status: AC

## 2023-12-26 MED ORDER — DOXYCYCLINE HYCLATE 100 MG PO TABS: 100.0000 mg | ORAL_TABLET | Freq: Two times a day (BID) | ORAL | 0 refills | Status: AC

## 2023-12-26 NOTE — Discharge Instructions (Addendum)
 Please read below to learn more about the medications, dosages and frequencies that I recommend to help alleviate your symptoms and to get you feeling better soon:   Omnicef (cefdinir):  Please take one (1) dose twice daily for 7 days.  This antibiotic can cause upset stomach, this will resolve once antibiotics are complete.  You are welcome to take a probiotic, eat yogurt, take Imodium while taking this medication.  Please avoid other systemic medications such as Maalox, Pepto-Bismol or milk of magnesia as they can interfere with the body's ability to absorb the antibiotics.   Adoxa, Vibramycin (doxycycline): Please take one (1) dose twice daily for 7 days.  This antibiotic can make you more sensitive to sunlight and may cause you to burn more easily.  Please avoid direct exposure while taking.  Please also avoid taking this medication with foods that contain calcium such as dairy products (milk, yogurt, cheese, ice cream).  Calcium binds with doxycycline and prevents your body from absorbing it.  Please separate dairy products contain calcium and taking doxycycline by 2 hours.   ProAir, Ventolin, Proventil (albuterol): This inhaled medication contains a short acting beta agonist bronchodilator.  This medication works on the smooth muscle that opens and constricts of your airways by relaxing the muscle.  The result of relaxation of the smooth muscle is increased air movement and improved work of breathing.  This is a short acting medication that can be used every 4-6 hours as needed for increased work of breathing, shortness of breath, wheezing and excessive coughing.     Wixela Inhub (fluticasone and salmeterol): Please inhale 2 puffs twice daily.  This inhaled medication contains a corticosteroid and long-acting form of albuterol.  The inhaled steroid and this medication  is not absorbed into the body and will not cause side effects such as increased blood sugar levels, irritability, sleeplessness or  weight gain.  Inhaled corticosteroid are sort of like topical steroid creams but, as you can imagine, it is not practical to attempt to rub a steroid cream inside of your lungs.  The long-acting albuterol works similarly to the short acting albuterol found in your rescue inhaler but provides 24-hour relaxation of the smooth muscles that open and constrict your airways; your short acting rescue inhaler can only provide for a few hours this benefit for a few hours.  Please feel free to continue using your short acting rescue inhaler as often as needed throughout the day for shortness of breath, wheezing, and cough.   Continue Zyrtec and Montelukast daily.   If symptoms have not meaningfully improved in the next 5 to 7 days, please return for repeat evaluation or follow-up with your regular provider.  If symptoms have worsened in the next 3 to 5 days, please go to the emergency room for further evaluation.    Thank you for visiting urgent care today.  We appreciate the opportunity to participate in your care.

## 2023-12-26 NOTE — ED Triage Notes (Signed)
 Patient states since this morning she has been having a cough, shortness of breath, and nasal congestion. She states she had pneumonia in September and she did take her full course of medications. Pt denies any known sick exposures. She took Tylenol  cold and flu this morning which did not help.

## 2023-12-26 NOTE — ED Provider Notes (Signed)
 [facility]    CSN: [id number] Arrival date & time: 12/26/23  1308    HISTORY   Chief Complaint  Patient presents with   Cough   Nasal Congestion   HPI Morgan Leon is a pleasant, 53 y.o. female who presents to urgent care today. Patient states that since this morning she has been having cough and shortness of breath.  Patient states she is also having nasal congestion but that is typical this time of year in the fall.  States she takes Zyrtec and Singulair for history of allergies.  States she also takes Wixela in the fall for asthma, is currently not taking this.  Patient states she had pneumonia in September and completed the antibiotic she was prescribed.  States she has overall felt better but has had some lingering shortness of breath since the episode.  Patient denies known sick contacts.  Patient states she took Tylenol  Cold and flu this morning which did not help her symptoms.  Patient states she has plenty of albuterol at home, states she did not try inhaling albuterol when she felt short of breath.  The history is provided by the patient.  Cough  Past Medical History:  Diagnosis Date   Asthma    CAD (coronary artery disease)    Epilepsy (HCC)    Hyperlipidemia    Hypertension    Patient Active Problem List   Diagnosis Date Noted   Acute bilateral knee pain 12/18/2023   Back pain 10/11/2023   Lumbar radiculopathy 08/16/2023   Hyponatremia 08/26/2022   Mild intermittent asthma 03/06/2013   Dyslipidemia 09/13/2012   Cigarette smoker 06/18/2012   HTN, goal below 140/90 06/14/2012   Migraines 09/23/2010   Epilepsy (HCC) 09/23/2010   Past Surgical History:  Procedure Laterality Date   KNEE ARTHROSCOPY     LAMINECTOMY     PARTIAL HYSTERECTOMY     SPINAL FUSION     OB History     Gravida  3   Para      Term      Preterm      AB      Living         SAB      IAB      Ectopic      Multiple      Live Births  2          Home  Medications    Prior to Admission medications   Medication Sig Start Date End Date Taking? Authorizing Provider  amLODipine (NORVASC) 5 MG tablet Take 7.5 mg by mouth. 10/17/23  Yes [provider]  CALCIUM PO Take by mouth.   Yes [provider]  carbamazepine (TEGRETOL) 200 MG tablet Take 200 mg by mouth every 12 (twelve) hours.   Yes [provider]  carvedilol (COREG) 25 MG tablet Take 25 mg by mouth 2 (two) times daily with a meal.   Yes [provider]  cetirizine (ZYRTEC) 10 MG chewable tablet Chew 10 mg by mouth.   Yes [provider]  ezetimibe (ZETIA) 10 MG tablet Take 10 mg by mouth daily. 08/14/23  Yes [provider]  gabapentin (NEURONTIN) 300 MG capsule Take 900 mg by mouth. 10/15/23 01/13/24 Yes [provider]  lisinopril (ZESTRIL) 40 MG tablet Take 40 mg by mouth daily.   Yes [provider]  methocarbamol (ROBAXIN) 500 MG tablet Take 500 mg by mouth every 6 (six) hours as needed for muscle spasms. 12/12/23 01/11/24 Yes  [provider]  montelukast (SINGULAIR) 10 MG tablet Take 10 mg by mouth. 07/03/23  Yes [provider]  Multiple Vitamin (MULTIVITAMIN PO) Take by mouth.   Yes [provider]  traZODone (DESYREL) 50 MG tablet Take 100 mg by mouth at bedtime. 11/02/23  Yes [provider]    Family History Family History  Problem Relation Age of Onset   Kidney disease Mother    Social History Social History   Tobacco Use   Smoking status: Former    Types: Cigarettes    Passive exposure: Past   Smokeless tobacco: Never  Vaping Use   Vaping status: Never Used  Substance Use Topics   Alcohol use: Yes    Alcohol/week: 3.0 standard drinks of alcohol    Types: 3 Standard drinks or equivalent per week    Comment: socially   Drug use: Never   Allergies   Penicillins and Sulfa antibiotics  Review of Systems Review of Systems  Respiratory:  Positive for cough.     Pertinent findings revealed after performing a 14 point review of systems has been noted in the history of present illness.  Physical Exam Vital Signs BP 135/89 (BP Location: Right Arm)   Pulse 69   Temp 99.1 F (37.3 C) (Oral)   Resp 18   SpO2 97%   No data found.  Physical Exam Vitals and nursing note reviewed.  Constitutional:      General: She is awake. She is not in acute distress.    Appearance: Normal appearance. She is well-developed and well-groomed. She is not ill-appearing.  HENT:     Head: Normocephalic and atraumatic.     Salivary Glands: Right salivary gland is not diffusely enlarged or tender. Left salivary gland is not diffusely enlarged or tender.     Right Ear: Hearing, ear canal and external ear normal. A middle ear effusion is present. Tympanic membrane is bulging. Tympanic membrane is not injected or erythematous.     Left Ear: Hearing, ear canal and external ear normal. A middle ear effusion is present. Tympanic membrane is bulging. Tympanic membrane is not injected or erythematous.     Ears:     Comments: Bilateral EACs normal, both TMs bulging with clear fluid    Nose: Rhinorrhea present. No nasal deformity, septal deviation, signs of injury or nasal tenderness. Rhinorrhea is clear.     Right Nostril: Occlusion present. No foreign body, epistaxis or septal hematoma.     Left Nostril: Occlusion present. No foreign body, epistaxis or septal hematoma.     Right Turbinates: Enlarged, swollen and pale.     Left Turbinates: Enlarged, swollen and pale.     Right Sinus: No maxillary sinus tenderness or frontal sinus tenderness.     Left Sinus: No maxillary sinus tenderness or frontal sinus tenderness.     Mouth/Throat:     Lips: Pink. No lesions.     Mouth: Mucous membranes are moist. No oral lesions.     Tongue: No lesions. Tongue does not deviate from midline.     Palate: No mass and lesions.     Pharynx: Oropharynx is clear. Uvula midline. Postnasal drip  present. No pharyngeal swelling, oropharyngeal exudate, posterior oropharyngeal erythema or uvula swelling.     Tonsils: No tonsillar exudate. 0 on the right. 0 on the left.     Comments: Postnasal drip Eyes:     General: Lids are normal.        Right eye: No discharge.  Left eye: No discharge.     Conjunctiva/sclera: Conjunctivae normal.     Right eye: Right conjunctiva is not injected.     Left eye: Left conjunctiva is not injected.  Neck:     Trachea: Trachea and phonation normal.  Cardiovascular:     Rate and Rhythm: Normal rate and regular rhythm.  Pulmonary:     Effort: Pulmonary effort is normal.     Breath sounds: Normal breath sounds and air entry. No stridor, decreased air movement or transmitted upper airway sounds. No decreased breath sounds, wheezing, rhonchi or rales.  Chest:     Chest wall: No tenderness.  Musculoskeletal:        General: Normal range of motion.     Cervical back: Full passive range of motion without pain, normal range of motion and neck supple. Normal range of motion.  Lymphadenopathy:     Cervical: No cervical adenopathy.  Skin:    General: Skin is warm and dry.     Findings: No erythema or rash.  Neurological:     General: No focal deficit present.     Mental Status: She is alert and oriented to person, place, and time. Mental status is at baseline.  Psychiatric:        Attention and Perception: Attention and perception normal.        Mood and Affect: Mood and affect normal.        Speech: Speech normal.        Behavior: Behavior normal. Behavior is cooperative.        Thought Content: Thought content normal.     Visual Acuity Right Eye Distance:   Left Eye Distance:   Bilateral Distance:    Right Eye Near:   Left Eye Near:    Bilateral Near:     UC Couse / Diagnostics / Procedures:     Radiology No results found.  Procedures Procedures (including critical care time) EKG  Pending results:  Labs Reviewed - No data to  display  Medications Ordered in UC: Medications - No data to display  UC Diagnoses / Final Clinical Impressions(s)   I have reviewed the triage vital signs and the nursing notes.  Pertinent labs & imaging results that were available during my care of the patient were reviewed by me and considered in my medical decision making (see chart for details).    Final diagnoses:  Acute cough  Shortness of breath  History of bacterial pneumonia  Mild intermittent asthma with acute exacerbation   Patient advised that x-ray not indicated at this time due to normal lung exam.  Recommend patient resume Wixela as this is now the fall season and have encouraged patient to use albuterol when feeling short of breath.  Due to patient's history of pneumonia, I provided her with antibiotics for pneumonia prophylaxis.  Recommend patient continue Zyrtec and Singulair.  Conservative care recommended.  Return cautions advised.  Please see discharge instructions below for details of plan of care as provided to patient. ED Prescriptions     Medication Sig Dispense Auth. Provider   fluticasone-salmeterol (WIXELA INHUB) 250-50 MCG/ACT AEPB Inhale 1 puff into the lungs in the morning and at bedtime. 60 each Joesph Shaver Scales, PA-C   cefdinir (OMNICEF) 300 MG capsule Take 1 capsule (300 mg total) by mouth 2 (two) times daily for 7 days. 14 capsule Joesph Shaver Scales, PA-C   doxycycline (VIBRA-TABS) 100 MG tablet Take 1 tablet (100 mg total) by mouth 2 (two) times  daily for 7 days. 14 tablet Joesph Shaver Scales, PA-C      PDMP not reviewed this encounter.  Pending results:  Labs Reviewed - No data to display    Discharge Instructions      Please read below to learn more about the medications, dosages and frequencies that I recommend to help alleviate your symptoms and to get you feeling better soon:   Omnicef (cefdinir):  Please take one (1) dose twice daily for 7 days.  This antibiotic can cause  upset stomach, this will resolve once antibiotics are complete.  You are welcome to take a probiotic, eat yogurt, take Imodium while taking this medication.  Please avoid other systemic medications such as Maalox, Pepto-Bismol or milk of magnesia as they can interfere with the body's ability to absorb the antibiotics.   Adoxa, Vibramycin (doxycycline): Please take one (1) dose twice daily for 7 days.  This antibiotic can make you more sensitive to sunlight and may cause you to burn more easily.  Please avoid direct exposure while taking.  Please also avoid taking this medication with foods that contain calcium such as dairy products (milk, yogurt, cheese, ice cream).  Calcium binds with doxycycline and prevents your body from absorbing it.  Please separate dairy products contain calcium and taking doxycycline by 2 hours.   ProAir, Ventolin, Proventil (albuterol): This inhaled medication contains a short acting beta agonist bronchodilator.  This medication works on the smooth muscle that opens and constricts of your airways by relaxing the muscle.  The result of relaxation of the smooth muscle is increased air movement and improved work of breathing.  This is a short acting medication that can be used every 4-6 hours as needed for increased work of breathing, shortness of breath, wheezing and excessive coughing.     Wixela Inhub (fluticasone and salmeterol): Please inhale 2 puffs twice daily.  This inhaled medication contains a corticosteroid and long-acting form of albuterol.  The inhaled steroid and this medication  is not absorbed into the body and will not cause side effects such as increased blood sugar levels, irritability, sleeplessness or weight gain.  Inhaled corticosteroid are sort of like topical steroid creams but, as you can imagine, it is not practical to attempt to rub a steroid cream inside of your lungs.  The long-acting albuterol works similarly to the short acting albuterol found in your  rescue inhaler but provides 24-hour relaxation of the smooth muscles that open and constrict your airways; your short acting rescue inhaler can only provide for a few hours this benefit for a few hours.  Please feel free to continue using your short acting rescue inhaler as often as needed throughout the day for shortness of breath, wheezing, and cough.   Continue Zyrtec and Montelukast daily.   If symptoms have not meaningfully improved in the next 5 to 7 days, please return for repeat evaluation or follow-up with your regular provider.  If symptoms have worsened in the next 3 to 5 days, please go to the emergency room for further evaluation.    Thank you for visiting urgent care today.  We appreciate the opportunity to participate in your care.       Disposition Upon Discharge:  Condition: stable for discharge home  Patient presented with an acute illness with associated systemic symptoms and significant discomfort requiring urgent management. In my opinion, this is a condition that a prudent lay person (someone who possesses an average knowledge of health and medicine) may potentially  expect to result in complications if not addressed urgently such as respiratory distress, impairment of bodily function or dysfunction of bodily organs.   Routine symptom specific, illness specific and/or disease specific instructions were discussed with the patient and/or caregiver at length.   As such, the patient has been evaluated and assessed, work-up was performed and treatment was provided in alignment with urgent care protocols and evidence based medicine.  Patient/parent/caregiver has been advised that the patient may require follow up for further testing and treatment if the symptoms continue in spite of treatment, as clinically indicated and appropriate.  Patient/parent/caregiver has been advised to return to the [facility] or PCP if no better; to PCP or the Emergency Department if new signs and symptoms  develop, or if the current signs or symptoms continue to change or worsen for further workup, evaluation and treatment as clinically indicated and appropriate  The patient will follow up with their current PCP if and as advised. If the patient does not currently have a PCP we will assist them in obtaining one.   The patient may need specialty follow up if the symptoms continue, in spite of conservative treatment and management, for further workup, evaluation, consultation and treatment as clinically indicated and appropriate.  Patient/parent/caregiver verbalized understanding and agreement of plan as discussed.  All questions were addressed during visit.  Please see discharge instructions below for further details of plan.  This office note has been dictated using Teaching laboratory technician.  Unfortunately, this method of dictation can sometimes lead to typographical or grammatical errors.  I apologize for your inconvenience in advance if this occurs.  Please do not hesitate to reach out to me if clarification is needed.      Joesph Shaver Scales, PA-C 12/26/23 1415
